# Patient Record
Sex: Male | Born: 1960 | Race: White | Hispanic: No | Marital: Married | Smoking: Never smoker
Health system: Southern US, Community
[De-identification: ages and names within clinical notes are randomized; demographics above are authoritative.]

## PROBLEM LIST (undated history)

## (undated) DIAGNOSIS — E785 Hyperlipidemia, unspecified: Secondary | ICD-10-CM

## (undated) DIAGNOSIS — I82409 Acute embolism and thrombosis of unspecified deep veins of unspecified lower extremity: Secondary | ICD-10-CM

## (undated) HISTORY — DX: Hyperlipidemia, unspecified: E78.5

## (undated) HISTORY — PX: TONSILLECTOMY: SUR1361

## (undated) HISTORY — PX: GALLBLADDER SURGERY: SHX652

## (undated) HISTORY — PX: COLONOSCOPY: SHX174

## (undated) HISTORY — PX: HEMORRHOID SURGERY: SHX153

## (undated) HISTORY — DX: Acute embolism and thrombosis of unspecified deep veins of unspecified lower extremity: I82.409

## (undated) HISTORY — PX: HERNIA REPAIR: SHX51

---
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
## 2003-07-28 ENCOUNTER — Encounter (INDEPENDENT_AMBULATORY_CARE_PROVIDER_SITE_OTHER): Payer: Self-pay | Admitting: Specialist

## 2003-07-28 ENCOUNTER — Ambulatory Visit (HOSPITAL_COMMUNITY): Admission: RE | Admit: 2003-07-28 | Discharge: 2003-07-28 | Payer: Self-pay | Admitting: *Deleted

## 2006-01-03 HISTORY — PX: HERNIA REPAIR: SHX51

## 2006-12-04 ENCOUNTER — Ambulatory Visit (HOSPITAL_BASED_OUTPATIENT_CLINIC_OR_DEPARTMENT_OTHER): Admission: RE | Admit: 2006-12-04 | Discharge: 2006-12-04 | Payer: Self-pay | Admitting: *Deleted

## 2006-12-04 ENCOUNTER — Encounter (INDEPENDENT_AMBULATORY_CARE_PROVIDER_SITE_OTHER): Payer: Self-pay | Admitting: *Deleted

## 2010-05-18 NOTE — Op Note (Signed)
NAME:  Adam [name], Adam [name] NO.:  [id number]   MEDICAL RECORD NO.:  [id number]          PATIENT TYPE:  AMB   LOCATION:  DSC                          FACILITY:  [facility]   PHYSICIAN:  Alfonse Ras, MD   DATE OF BIRTH:  June 14, 1960   DATE OF PROCEDURE:  12/04/2006  DATE OF DISCHARGE:                               OPERATIVE REPORT   PREOPERATIVE DIAGNOSIS:  Right inguinal hernia.   POSTOPERATIVE DIAGNOSIS:  Right inguinal hernia, indirect.   PROCEDURE:  Right inguinal hernia repair with mesh.   SURGEON:  Alfonse Ras, MD   ANESTHESIA:  General laryngeal mask.   DESCRIPTION:  The patient was taken to the operating room, placed in the  supine position.  After adequate general anesthesia was induced using  the laryngeal mask, the right groin was prepped and draped in the normal  sterile fashion.  Using an oblique incision over the inguinal canal, I  dissected down onto the external oblique fascia.  This was opened along  its fibers down to the external ring.  Flaps were created down to the  inguinal ligament, the Cooper's ligament, as well as along the  transversalis fascia and muscle.  The spermatic cord was surrounded with  a Penrose drain and the external ring.  It was mobilized.  Ilioinguinal  nerve was retracted superiorly.  Indirect hernia sac was dissected off  the spermatic cord and twisted and ligated at the internal ring.  The  preperitoneal fat was excised off the spermatic cord as well using Bovie  electrocautery.  A piece of 3 x 6 Atrium mesh was then placed over the  floor of Hesselbach's triangle and sutured to the pubic tubercle  medially, overlapping 2 cm along the transversalis fascia split and  brought out lateral to the internal ring.  It was tacked inferiorly to  the inguinal ligament.  All tissues were injected with 0.5 Marcaine.  Spermatic cord was placed in its natural position.  The external oblique  fascia was closed with a running 3-0  Vicryl suture.  Skin incision was  closed with staples.  A sterile dressing was applied.  The patient  tolerated the procedure well and went to PACU in a good condition.      Alfonse Ras, MD  Electronically Signed     KRE/MEDQ  D:  12/04/2006  T:  12/04/2006  Job:  [id number]

## 2010-05-21 NOTE — Op Note (Signed)
NAME:  Adam [name], Adam [name] NO.:  [id number]   MEDICAL RECORD NO.:  [id number]                   PATIENT TYPE:  AMB   LOCATION:  DAY                                  FACILITY:  [facility]   PHYSICIAN:  Vikki Ports, M.D.         DATE OF BIRTH:  [birth year]   DATE OF PROCEDURE:  07/28/2003  DATE OF DISCHARGE:                                 OPERATIVE REPORT   PREOPERATIVE DIAGNOSIS:  Grade III internal hemorrhoids with prolapse.   POSTOPERATIVE DIAGNOSIS:  Grade III internal hemorrhoids with prolapse.   PROCEDURE:  Procedure for prolapsing hemorrhoids and anopexy.   SURGEON:  Vikki Ports, M.D.   ANESTHESIA:  General.   DESCRIPTION OF PROCEDURE:  Patient was taken to the operating room and  placed in a supine position.  After general anesthesia was induced, the  patient was placed in the prone jack-knife position.  Perianal and rectal  prep undertaken.  Anal dilatation was accomplished to three fingers.  Hemorrhoidal bundles were injected with Marcaine and Wydase, and then the  internal and external sphincter muscles were injected with an additional 30  cc of 0.25% Marcaine without epi.  A 2-0 purse-string Prolene suture was  then placed approximately 5 cm proximal to the dentate line.  A stapling  device was placed.  The purse-string suture was tied down around the anvil.  The stapler was closed, held in place for 45 seconds, fired and removed.  Inspection of the staple line showed no evidence of bleeding.  Gelfoam  packing was placed.  Patient tolerated the procedure well and went to the  PACU in good condition.                                               Vikki Ports, M.D.    KRH/MEDQ  D:  07/28/2003  T:  07/28/2003  Job:  [id number]

## 2013-09-05 ENCOUNTER — Other Ambulatory Visit: Payer: Self-pay | Admitting: Internal Medicine

## 2013-09-05 ENCOUNTER — Ambulatory Visit
Admission: RE | Admit: 2013-09-05 | Discharge: 2013-09-05 | Disposition: A | Discharge status: Home / Self Care | Payer: BC Managed Care – PPO | Source: Ambulatory Visit | Attending: Internal Medicine | Admitting: Internal Medicine

## 2013-09-05 DIAGNOSIS — R0989 Other specified symptoms and signs involving the circulatory and respiratory systems: Principal | ICD-10-CM

## 2013-09-05 DIAGNOSIS — R0609 Other forms of dyspnea: Secondary | ICD-10-CM

## 2013-10-30 ENCOUNTER — Other Ambulatory Visit: Payer: Self-pay | Admitting: Internal Medicine

## 2013-10-30 DIAGNOSIS — R0600 Dyspnea, unspecified: Secondary | ICD-10-CM

## 2013-10-31 ENCOUNTER — Ambulatory Visit (INDEPENDENT_AMBULATORY_CARE_PROVIDER_SITE_OTHER): Payer: BC Managed Care – PPO | Admitting: Internal Medicine

## 2013-10-31 DIAGNOSIS — R0600 Dyspnea, unspecified: Secondary | ICD-10-CM

## 2013-10-31 LAB — PULMONARY FUNCTION TEST
DL/VA % pred: 138 %
DL/VA: 6.49 ml/min/mmHg/L
DLCO unc % pred: 127 %
DLCO unc: 42.89 ml/min/mmHg
FEF 25-75 POST: 5.01 L/s
FEF 25-75 Pre: 4.87 L/sec
FEF2575-%CHANGE-POST: 2 %
FEF2575-%PRED-POST: 144 %
FEF2575-%Pred-Pre: 140 %
FEV1-%Change-Post: 0 %
FEV1-%PRED-POST: 103 %
FEV1-%PRED-PRE: 104 %
FEV1-POST: 4.15 L
FEV1-PRE: 4.17 L
FEV1FVC-%CHANGE-POST: -2 %
FEV1FVC-%PRED-PRE: 111 %
FEV6-%Change-Post: 1 %
FEV6-%Pred-Post: 99 %
FEV6-%Pred-Pre: 97 %
FEV6-Post: 4.95 L
FEV6-Pre: 4.87 L
FEV6FVC-%Pred-Post: 104 %
FEV6FVC-%Pred-Pre: 104 %
FVC-%CHANGE-POST: 2 %
FVC-%Pred-Post: 96 %
FVC-%Pred-Pre: 94 %
FVC-Post: 4.99 L
FVC-Pre: 4.87 L
PRE FEV1/FVC RATIO: 86 %
PRE FEV6/FVC RATIO: 100 %
Post FEV1/FVC ratio: 83 %
Post FEV6/FVC ratio: 100 %
RV % PRED: 88 %
RV: 1.9 L
TLC % pred: 94 %
TLC: 6.77 L

## 2013-10-31 NOTE — Progress Notes (Signed)
PFT done today. 

## 2013-11-01 ENCOUNTER — Ambulatory Visit (INDEPENDENT_AMBULATORY_CARE_PROVIDER_SITE_OTHER): Payer: BC Managed Care – PPO | Admitting: Internal Medicine

## 2013-11-01 ENCOUNTER — Encounter: Payer: Self-pay | Admitting: Internal Medicine

## 2013-11-01 VITALS — BP 108/72 | HR 70 | Ht 71.0 in | Wt 205.0 lb

## 2013-11-01 DIAGNOSIS — R0609 Other forms of dyspnea: Secondary | ICD-10-CM | POA: Insufficient documentation

## 2013-11-01 NOTE — Assessment & Plan Note (Signed)
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 He did not improve with bronchodilators, or recognize benefit from trial.  I do not suspect ischemia, cardiomyopathy, Pulmonary Hypertension or anemia, but it is appropriate that he will be seeing cardiology next. An echo or cardiopulmonary stress test MIGHT show something. He credits Dr Alessandra BevelsVaughn with improving what sounds like a persistent bronchitis.  I will be able to see him back if needed.

## 2013-11-01 NOTE — Patient Instructions (Signed)
I agree with plan for you to see cardiology/ Dr Katrinka BlazingSmith If any further testing were cone, it might include an echocardiogram and/or cardiopulmonary stress test.

## 2013-11-01 NOTE — Progress Notes (Signed)
11/01/13- 53 yoM never smoker referred courtesy of Dr Alessandra BevelsVaughn FOLLOWS FOR: patient here for sob. Patient denies cough, wheeziing and chest tightness.  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 Little cough or wheeze now. He describes gradual onset of dyspnea with heavy exertion, abnormal for him, x 2 months. No sudden event. Wife says she doesn't notice it. Previously treated by PCP with steroids, inhalers- little difference. Strong odors trigger cough. Some seasonal pollen rhinitis. Allergy skin testing + dust mites, years ago. Broadly positive when tested as child. No allergy shots. Denies hx heart disease, anemia. 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 No asbestos. No chest pain, palpitation, leg pain/ swelling.  Father and PGF heavy smokers with COPD CXR 09/05/13-  IMPRESSION:  No active cardiopulmonary disease.  Electronically Signed  By: Dwyane DeePaul Barry M.D.  On: 09/05/2013 10:54 PFT 11/01/13- Within normal. FVC 4.99/ 96%, FEV1 4.15/ 103%, FEV1/FVC 0.83, FEF25-75% 5.01/144%. No response to BD, TLC 94%, DLCO 17%  Prior to Admission medications   Medication Sig Start Date End Date Taking? Authorizing Provider  Acetylcysteine (NAC 600) 600 MG CAPS Take 1 capsule by mouth 2 (two) times daily.   Yes Historical Provider, MD  Cholecalciferol (VITAMIN D-3) 5000 UNITS TABS Take 1 tablet by mouth daily.   Yes Historical Provider, MD  clomiPHENE (CLOMID) 50 MG tablet Take 50 mg by mouth daily. 10/14/13  Yes Historical Provider, MD  DHEA 25 MG CAPS Take 1 capsule by mouth daily.   Yes Historical Provider, MD  fenofibrate 160 MG tablet Take 160 mg by mouth daily. 09/21/13  Yes Historical Provider, MD  Potassium Gluconate 595 MG CAPS Take 595 mg by mouth daily.   Yes Historical Provider, MD  vitamin A 4332925000 UNIT capsule Take 25,000 Units by mouth daily.   Yes Historical Provider, MD   Past Medical History   Diagnosis Date  . Hyperlipidemia    Past Surgical History  Procedure Laterality Date  . Tonsillectomy    . Hemorrhoid surgery    . Gallbladder surgery    . Hernia repair     History reviewed. No pertinent family history. History   Social History  . Marital Status: Married    Spouse Name: N/A    Number of Children: 3  . Years of Education: 12   Occupational History  . Not on file.   Social History Main Topics  . Smoking status: Never Smoker   . Smokeless tobacco: Never Used  . Alcohol Use: No  . Drug Use: No  . Sexual Activity: Not on file   Other Topics Concern  . Not on file   Social History Narrative  . No narrative on file   ROS-see HPI Constitutional:   No-   weight loss, night sweats, fevers, chills, fatigue, lassitude. HEENT:   No-  headaches, +difficulty swallowing, tooth/dental problems, sore throat,       +sneezing, itching, +ear ache, +nasal congestion, post nasal drip,  CV:  No-   chest pain, orthopnea, PND, swelling in lower extremities, anasarca,                                  dizziness, palpitations Resp: +shortness of breath with exertion or at rest.              No-   productive cough,  No  non-productive cough,  No- coughing up of blood.              No-   change in color of mucus.  No- wheezing.   Skin: No-   rash or lesions. GI:  No-   heartburn, indigestion, abdominal pain, nausea, vomiting, diarrhea,                 change in bowel habits, loss of appetite GU: No-   dysuria, change in color of urine, no urgency or frequency.  No- flank pain. MS:  No-   joint pain or swelling.  No- decreased range of motion.  No- back pain. Neuro-     nothing unusual Psych:  No- change in mood or affect. No depression or anxiety.  No memory loss.  OBJ- Physical Exam General- Alert, Oriented, Affect-appropriate, Distress- none acute, +appears fit and well Skin- rash-none, lesions- none, excoriation- none Lymphadenopathy- none Head- atraumatic             Eyes- Gross vision intact, PERRLA, conjunctivae and secretions clear            Ears- Hearing, canals-normal            Nose- Clear, no-Septal dev, mucus, polyps, erosion, perforation             Throat- Mallampati II , mucosa clear , drainage- none, tonsils- atrophic Neck- flexible , trachea midline, no stridor , thyroid nl, carotid no bruit Chest - symmetrical excursion , unlabored           Heart/CV- RRR , no murmur , no gallop  , no rub, nl s1 s2                           - JVD- none , edema- none, stasis changes- none, varices- none           Lung- clear to P&A, wheeze- none, cough- none , dullness-none, rub- none           Chest wall-  Abd- tender-no, distended-no, bowel sounds-present, HSM- no Br/ Gen/ Rectal- Not done, not indicated Extrem- cyanosis- none, clubbing, none, atrophy- none, strength- nl Neuro- grossly intact to observation

## 2013-11-11 ENCOUNTER — Ambulatory Visit (INDEPENDENT_AMBULATORY_CARE_PROVIDER_SITE_OTHER): Payer: BC Managed Care – PPO | Admitting: Interventional Cardiology

## 2013-11-11 ENCOUNTER — Encounter: Payer: Self-pay | Admitting: Interventional Cardiology

## 2013-11-11 VITALS — BP 122/80 | HR 67 | Ht 70.5 in | Wt 203.2 lb

## 2013-11-11 DIAGNOSIS — R0609 Other forms of dyspnea: Secondary | ICD-10-CM

## 2013-11-11 DIAGNOSIS — R5383 Other fatigue: Secondary | ICD-10-CM | POA: Insufficient documentation

## 2013-11-11 NOTE — Patient Instructions (Addendum)
Your physician recommends that you continue on your current medications as directed. Please refer to the Current Medication list given to you today.  Your physician has requested that you have an exercise tolerance test. For further information please visit [email or web address]. Please also follow instruction sheet, as given.   Your physician recommends that you schedule a follow-up appointment as needed  

## 2013-11-11 NOTE — Progress Notes (Signed)
Patient ID: Adam [name], male   DOB: 06/25/1960, 53 y.o.   MRN: [id number]   Date: 11/11/2013 ID: Adam [name], DOB 05/29/1960, MRN [id number] PCP: Nonnie DoneSLATOSKY,JOHN J., MD  Reason: dyspnea  ASSESSMENT;  1. Dyspnea, poorly characterized, but exertional. No associated chest discomfort 2. Hypo-testosteronism 3. Hyperlipidemia  PLAN:  1. Exercise treadmill test   SUBJECTIVE: Adam [name] is a 53 y.o. male who is referred for cardiac evaluation. He gives a vague history. There is no chest discomfort. He talks of having dyspnea when he has intercourse. He has a problem with hypogonadism. He is active. Walking up an incline or rapidly also causes some dyspnea. This is been present for some time. He saw Dr. Jetty Duhamellinton Young for pulmonary assessment due to the dyspnea and apparently no significant abnormalities were found. A pulmonary function testan chest x-ray was unremarkable. He denies exertional chest pain. Is no history of heart disease. He denies hypertension.   Allergies  Allergen Reactions  . Ceftin [Cefuroxime Axetil] Rash  . Tetanus Toxoids Swelling    Swelling     Current Outpatient Prescriptions on File Prior to Visit  Medication Sig Dispense Refill  . Cholecalciferol (VITAMIN D-3) 5000 UNITS TABS Take 1 tablet by mouth daily.    . clomiPHENE (CLOMID) 50 MG tablet Take 50 mg by mouth daily.    Marland Kitchen. DHEA 25 MG CAPS Take 1 capsule by mouth daily.    . fenofibrate 160 MG tablet Take 160 mg by mouth every other day.     . Potassium Gluconate 595 MG CAPS Take 595 mg by mouth daily.    . vitamin A [id number] UNIT capsule Take 25,000 Units by mouth daily.    . Acetylcysteine (NAC 600) 600 MG CAPS Take 1 capsule by mouth 2 (two) times daily.     No current facility-administered medications on file prior to visit.    Past Medical History  Diagnosis Date  . Hyperlipidemia     Past Surgical History  Procedure Laterality Date  . Tonsillectomy    . Hemorrhoid surgery    .  Gallbladder surgery    . Hernia repair    . Hernia repair  2008  . Tonsillectomy  1968  . Hemorrhoid surgery      History   Social History  . Marital Status: Married    Spouse Name: N/A    Number of Children: 3  . Years of Education: 12   Occupational History  . Not on file.   Social History Main Topics  . Smoking status: Never Smoker   . Smokeless tobacco: Never Used  . Alcohol Use: No  . Drug Use: No  . Sexual Activity: Yes   Other Topics Concern  . Not on file   Social History Narrative    Family History  Problem Relation Age of Onset  . Hyperlipidemia Father   . Parkinson's disease Maternal Grandmother   . Cancer Paternal Grandfather     ROS: appetite is stable. No claudication, edema, orthopnea, palpitation, syncope, transient neurological symptoms, abdominal pain, nausea, vomiting.. Other systems negative for complaints.  OBJECTIVE: BP 122/80 mmHg  Pulse 67  Ht 5' 10.5" (1.791 m)  Wt 203 lb 3.2 oz (92.171 kg)  BMI 28.73 kg/m2,  General: No acute distress, slender, bearded, in no distress HEENT: normal without jaundice or pallor Neck: JVD flat. Carotids absent Chest: clear Cardiac: Murmur: normal. Gallop: none. Rhythm: normal. Other: normal Abdomen: Bruit: absent. Pulsation: absent Extremities: Edema: normal. Pulses: 2+ bilateral in  all stations Neuro: normal Psych: anxious  ECG: normal

## 2013-12-05 ENCOUNTER — Ambulatory Visit (HOSPITAL_COMMUNITY)
Admission: RE | Admit: 2013-12-05 | Discharge: 2013-12-05 | Disposition: A | Discharge status: Home / Self Care | Payer: BC Managed Care – PPO | Source: Ambulatory Visit | Attending: Cardiovascular Disease | Admitting: Cardiovascular Disease

## 2013-12-05 DIAGNOSIS — R0609 Other forms of dyspnea: Secondary | ICD-10-CM | POA: Insufficient documentation

## 2013-12-05 NOTE — Procedures (Signed)
Exercise Treadmill Test  Pre-Exercise Testing Evaluation  NSR, normal  Test  Exercise Tolerance Test Ordering MD: Lyn RecordsHenry W. Smith [name]    Unique Test No: 1  Treadmill:  1  Indication for ETT: exertional dyspnea  Contraindication to ETT: No   Stress Modality: exercise - treadmill  Cardiac Imaging Performed: non   Protocol: standard Bruce - maximal  Max BP:  199/103  Max MPHR (bpm):  168 85% MPR (bpm):  143  MPHR obtained (bpm):  171 % MPHR obtained:  101  Reached 85% MPHR (min:sec):  10:40  Total Exercise Time (min-sec):  13  Workload in METS:  15.3 Borg Scale: 15  Reason ETT Terminated:  fatigue    ST Segment Analysis At Rest: normal ST segments - no evidence of significant ST depression With Exercise: no evidence of significant ST depression  Other Information Arrhythmia:  No Angina during ETT:  absent (0) Quality of ETT:  diagnostic  ETT Interpretation:  normal - no evidence of ischemia by ST analysis  Comments: Excellent exercise tolerance. Hypertension at baseline and hypertensive response to exercise. Duke score 7.  Adam FairMihai Tishawn Friedhoff, MD, [name] [phone] office [phone] pager

## 2013-12-06 ENCOUNTER — Telehealth: Payer: Self-pay

## 2013-12-06 NOTE — Telephone Encounter (Signed)
Called to give pt gxt results.lmtcb 

## 2013-12-06 NOTE — Telephone Encounter (Signed)
-----   Message from Lesleigh NoeHenry W Smith III, MD sent at 12/05/2013  8:14 PM EST ----- Did well on the treadmill. There is some concern about blood pressure. He needs to have his blood pressure monitored 2-3 times per year for target being less than 140/90 mmHg. If he is above these levels most of the time, he will need to start therapy.

## 2013-12-06 NOTE — Telephone Encounter (Signed)
-----   Message from Adam W Smith III, MD sent at 12/05/2013  8:14 PM EST ----- Did well on the treadmill. There is some concern about blood pressure. He needs to have his blood pressure monitored 2-3 times per year for target being less than 140/90 mmHg. If he is above these levels most of the time, he will need to start therapy. 

## 2013-12-06 NOTE — Telephone Encounter (Signed)
Pt aware of gxt results.  Did well on the treadmill. There is some concern about blood pressure. He needs to have his blood pressure monitored 2-3 times per year for target being less than 140/90 mmHg. If he is above these levels most of the time, he will need to start therapy.  pt verbalized understanding.

## 2014-09-06 DIAGNOSIS — K219 Gastro-esophageal reflux disease without esophagitis: Secondary | ICD-10-CM | POA: Insufficient documentation

## 2014-09-06 DIAGNOSIS — J45909 Unspecified asthma, uncomplicated: Secondary | ICD-10-CM | POA: Insufficient documentation

## 2014-12-01 IMAGING — CR DG CHEST 2V
2 series · 2 of 2 positions shown · non-contrast
Comparison: None.

CLINICAL DATA: Shortness of breath for several months

EXAM:
CHEST  2 VIEW

[view not recorded (1 of 2)]
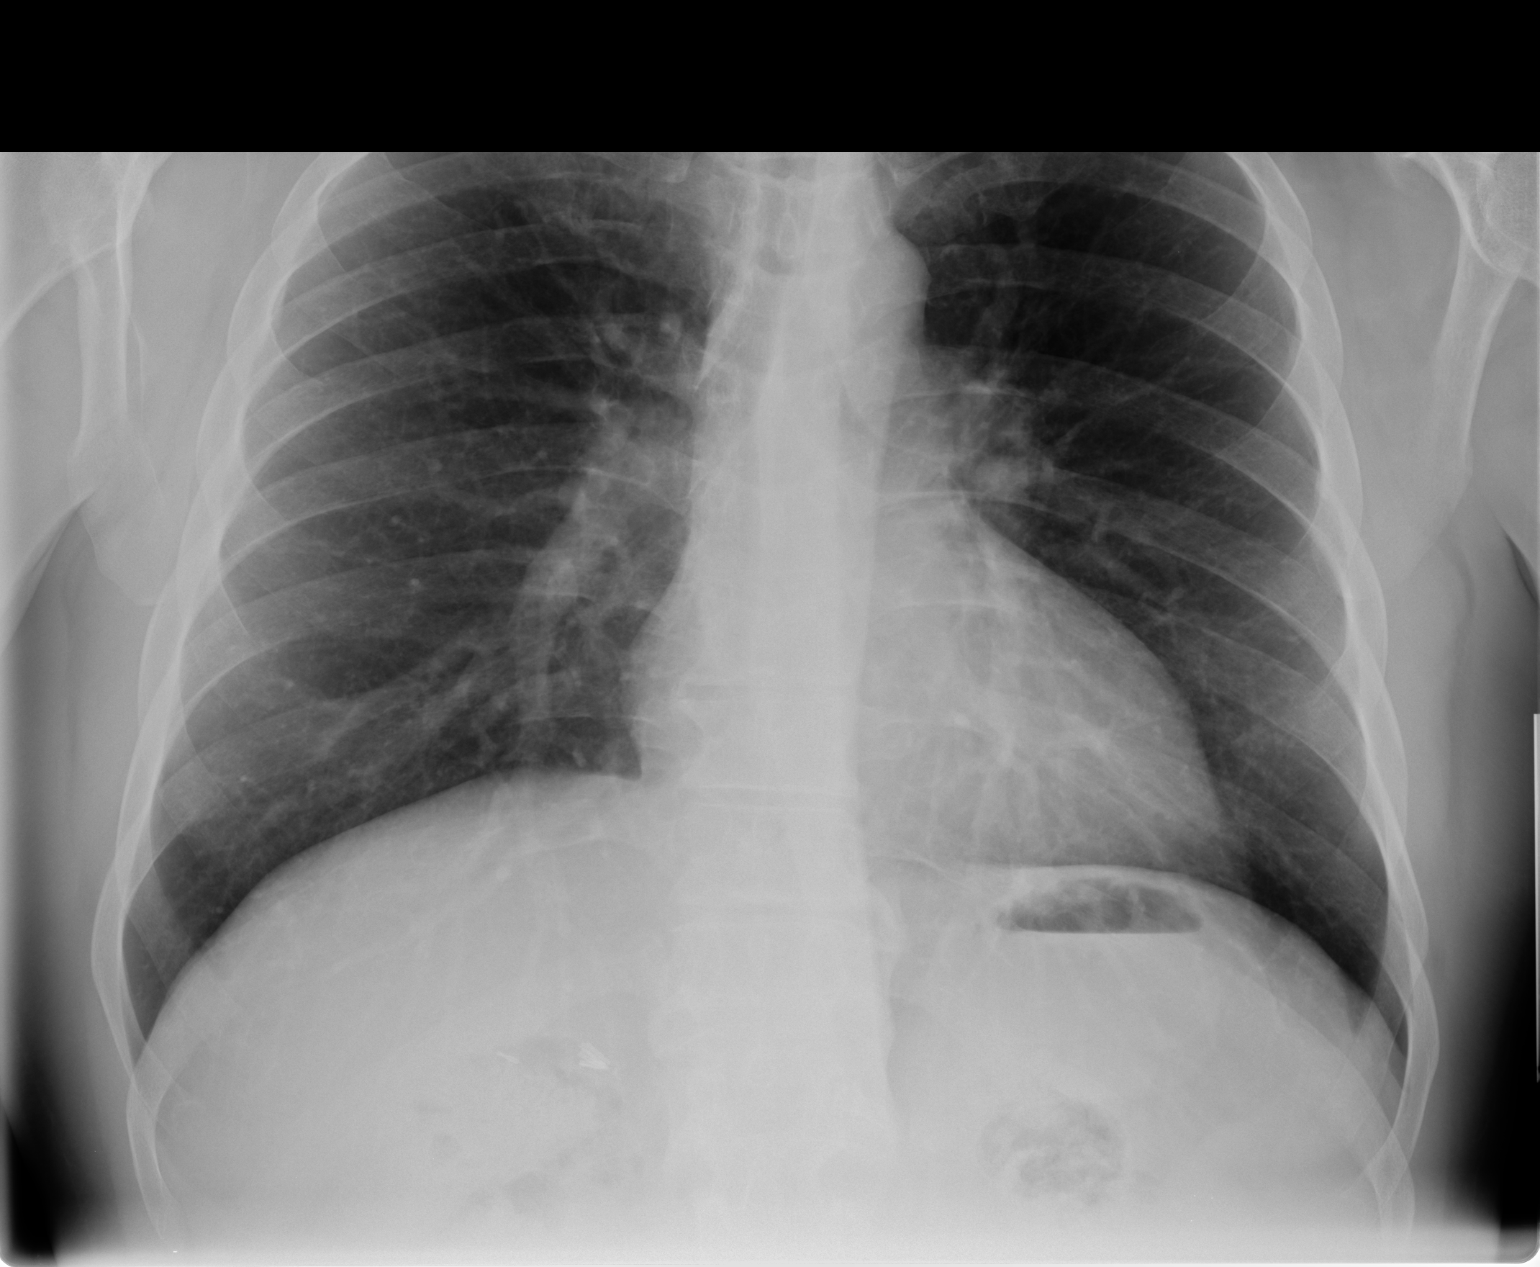

[view not recorded (2 of 2)]
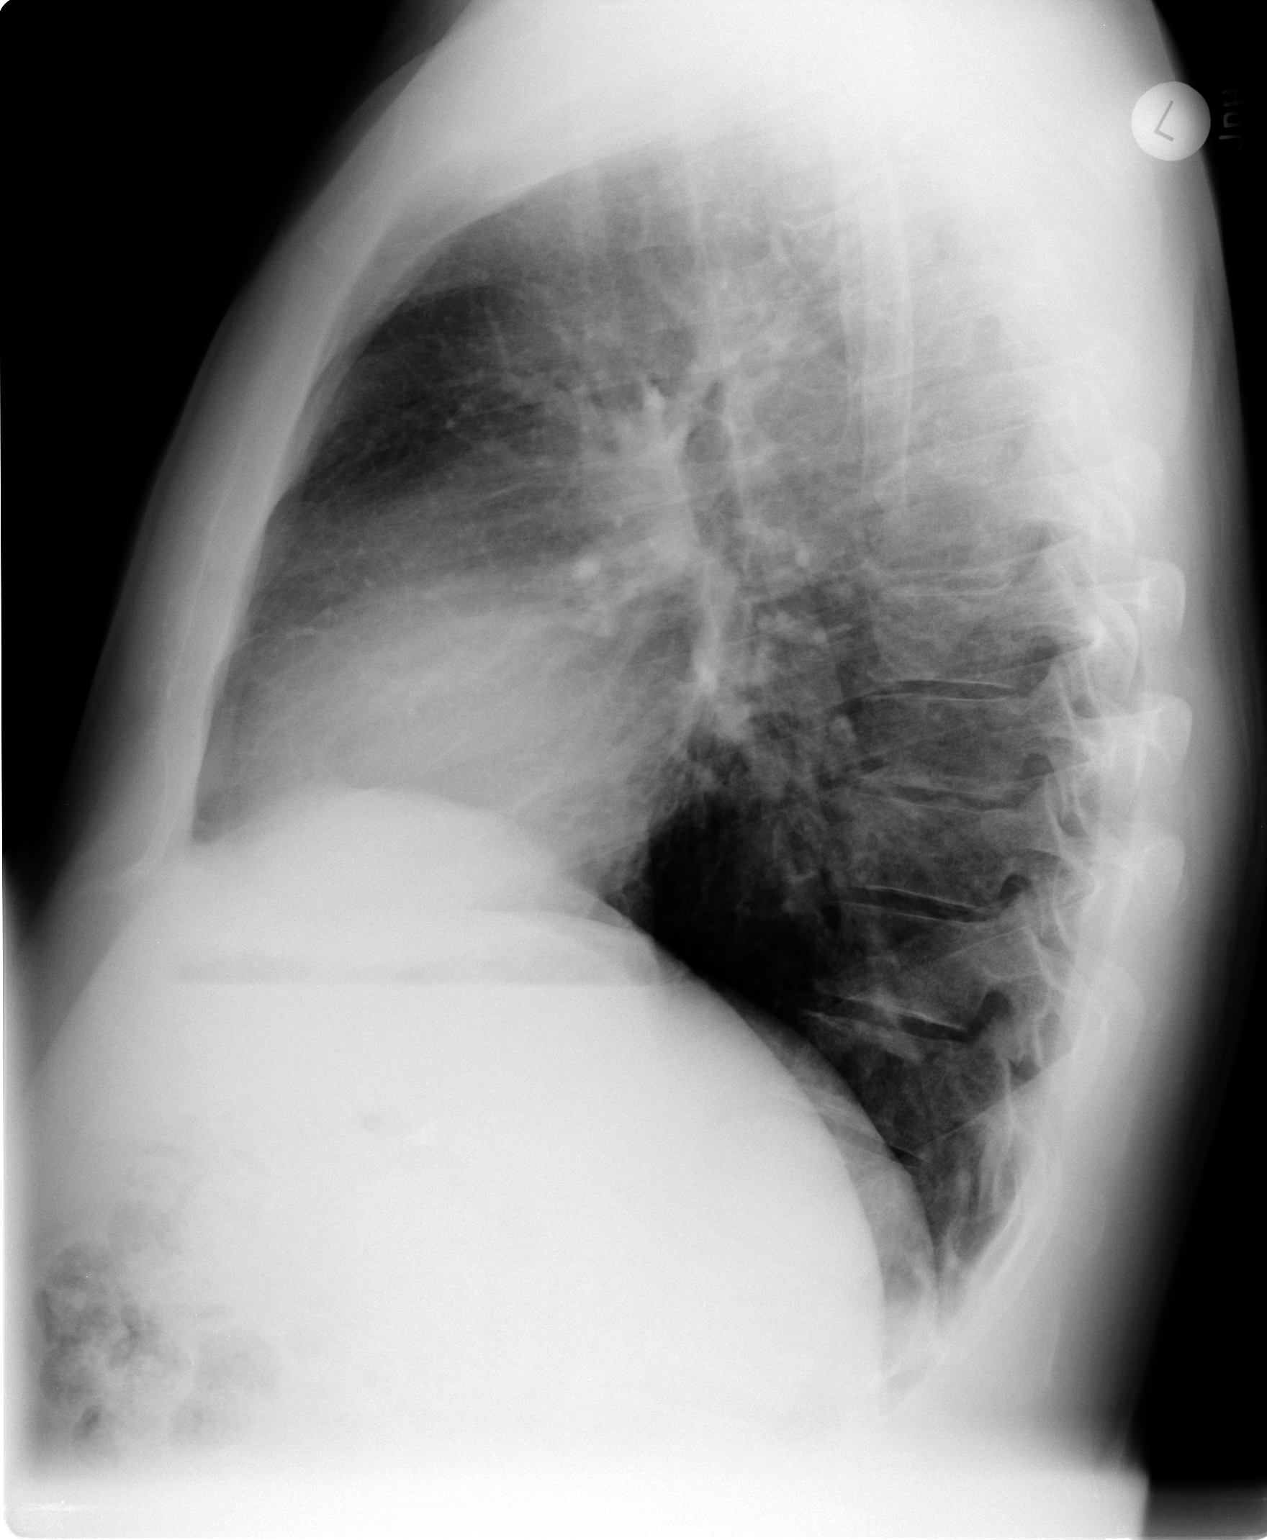

[2 of 2 positions shown; findings below may reference images not displayed]

FINDINGS: No active infiltrate or effusion is seen. Mediastinal and hilar
contours are unremarkable. The heart is within normal limits in
size. No bony abnormality is seen. Surgical clips are present in the
right upper quadrant from prior cholecystectomy.
IMPRESSION: No active cardiopulmonary disease.

## 2019-05-30 ENCOUNTER — Other Ambulatory Visit: Payer: Self-pay

## 2019-05-30 ENCOUNTER — Ambulatory Visit: Payer: Self-pay | Admitting: Family Medicine

## 2019-05-30 ENCOUNTER — Ambulatory Visit: Payer: Self-pay

## 2019-05-30 ENCOUNTER — Encounter: Payer: Self-pay | Admitting: Family Medicine

## 2019-05-30 VITALS — BP 120/90 | HR 81 | Ht 70.0 in | Wt 219.0 lb

## 2019-05-30 DIAGNOSIS — S56519A Strain of other extensor muscle, fascia and tendon at forearm level, unspecified arm, initial encounter: Secondary | ICD-10-CM

## 2019-05-30 DIAGNOSIS — M25521 Pain in right elbow: Secondary | ICD-10-CM

## 2019-05-30 MED ORDER — GABAPENTIN 100 MG PO CAPS: 200.0000 mg | ORAL_CAPSULE | Freq: Every day | ORAL | 3 refills | Status: DC

## 2019-05-30 MED ORDER — NITROGLYCERIN 0.2 MG/HR TD PT24: MEDICATED_PATCH | TRANSDERMAL | 1 refills | Status: DC

## 2019-05-30 NOTE — Progress Notes (Signed)
[name] [address] [place] [address] Phone: [phone] Subjective:   I Adam [name] am serving as a Neurosurgeon for Dr. Antoine Primas.  This visit occurred during the SARS-CoV-2 public health emergency.  Safety protocols were in place, including screening questions prior to the visit, additional usage of staff PPE, and extensive cleaning of exam room while observing appropriate contact time as indicated for disinfecting solutions.   I'm seeing this patient by the request  of:  Adam [name]., MD  CC: Right elbow pain  SWN:[id number]  Adam [name] is a 59 y.o. male coming in with complaint of right elbow pain. Pain radiates from the shoulder and radiates down the the arm. History of tennis elbow. Pain is intermitted.   Onset- chronic  Location - lateral Duration- painful throughout the day Character- achy  Aggravating factors- extension, flexion  Reliving factors-  Therapies tried- wears strap over forearm Severity- 0/10 at its worse      Past Medical History:  Diagnosis Date  . Hyperlipidemia    Past Surgical History:  Procedure Laterality Date  . GALLBLADDER SURGERY    . HEMORRHOID SURGERY    . HEMORRHOID SURGERY    . HERNIA REPAIR    . HERNIA REPAIR  2008  . TONSILLECTOMY    . TONSILLECTOMY  1968   Social History   Socioeconomic History  . Marital status: Married    Spouse name: Not on file  . Number of children: 3  . Years of education: 69  . Highest education level: Not on file  Occupational History  . Not on file  Tobacco Use  . Smoking status: Never Smoker  . Smokeless tobacco: Never Used  Substance and Sexual Activity  . Alcohol use: No  . Drug use: No  . Sexual activity: Yes  Other Topics Concern  . Not on file  Social History Narrative  . Not on file   Social Determinants of Health   Financial Resource Strain:   . Difficulty of Paying Living Expenses:   Food Insecurity:   . Worried About  Programme researcher, broadcasting/film/video in the Last Year:   . Barista in the Last Year:   Transportation Needs:   . Freight forwarder (Medical):   Marland Kitchen Lack of Transportation (Non-Medical):   Physical Activity:   . Days of Exercise per Week:   . Minutes of Exercise per Session:   Stress:   . Feeling of Stress :   Social Connections:   . Frequency of Communication with Friends and Family:   . Frequency of Social Gatherings with Friends and Family:   . Attends Religious Services:   . Active Member of Clubs or Organizations:   . Attends Banker Meetings:   Marland Kitchen Marital Status:    Allergies  Allergen Reactions  . Ceftin [Cefuroxime Axetil] Rash  . Tetanus Toxoids Swelling    Swelling    Family History  Problem Relation Age of Onset  . Hyperlipidemia Father   . Parkinson's disease Maternal Grandmother   . Cancer Paternal Grandfather     Current Outpatient Medications (Endocrine & Metabolic):  .  clomiPHENE (CLOMID) 50 MG tablet, Take 50 mg by mouth daily.  Current Outpatient Medications (Cardiovascular):  .  fenofibrate 160 MG tablet, Take 160 mg by mouth every other day.  .  nitroGLYCERIN (NITRODUR - DOSED IN MG/24 HR) 0.2 mg/hr patch, 1/4 patch daily  Current Outpatient Medications (Respiratory):  .  Albuterol Sulfate (PROAIR HFA IN), Inhale into the lungs as needed. .  mometasone (ASMANEX 14 METERED DOSES) 220 MCG/INH inhaler, Inhale 1 puff into the lungs daily. .  mometasone (NASONEX) 50 MCG/ACT nasal spray, Place 1 spray into the nose daily.    Current Outpatient Medications (Other):  Marland Kitchen  Acetylcysteine (NAC 600) 600 MG CAPS, Take 1 capsule by mouth 2 (two) times daily. .  Cholecalciferol (VITAMIN D-3) 5000 UNITS TABS, Take 1 tablet by mouth daily. Marland Kitchen  DHEA 25 MG CAPS, Take 1 capsule by mouth daily. Javier Docker Oil 300 MG CAPS, Take 1 capsule by mouth 2 (two) times daily. .  Potassium Gluconate 595 MG CAPS, Take 595 mg by mouth daily. .  ranitidine (ZANTAC) 300 MG  tablet, Take 300 mg by mouth at bedtime. .  vitamin A 25000 UNIT capsule, Take 25,000 Units by mouth daily. Marland Kitchen  gabapentin (NEURONTIN) 100 MG capsule, Take 2 capsules (200 mg total) by mouth at bedtime.   Reviewed prior external information including notes and imaging from  primary care provider As well as notes that were available from care everywhere and other healthcare systems.  Past medical history, social, surgical and family history all reviewed in electronic medical record.  No pertanent information unless stated regarding to the chief complaint.   Review of Systems:  No headache, visual changes, nausea, vomiting, diarrhea, constipation, dizziness, abdominal pain, skin rash, fevers, chills, night sweats, weight loss, swollen lymph nodes, body aches, joint swelling, chest pain, shortness of breath, mood changes. POSITIVE muscle aches  Objective  Blood pressure 120/90, pulse 81, height 5\' 10"  (1.778 m), weight 219 lb (99.3 kg), SpO2 96 %.   General: No apparent distress alert and oriented x3 mood and affect normal, dressed appropriately.  HEENT: Pupils equal, extraocular movements intact  Respiratory: Patient's speak in full sentences and does not appear short of breath  Cardiovascular: No lower extremity edema, non tender, no erythema  Neuro: Cranial nerves II through XII are intact, neurovascularly intact in all extremities with 2+ DTRs and 2+ pulses.  Gait normal with good balance and coordination.  MSK:   Right elbow shows the patient does have tenderness to palpation of the lateral epicondylar region.  4-5 strength with wrist extension against resistance.  This is significantly worse than the contralateral side.  Mild decrease in grip strength noted.  Limited musculoskeletal ultrasound was performed and interpreted by Lyndal Pulley  Limited ultrasound of patient's elbow shows the patient does have a large tear of the common extensor tendon noted.  Mild retraction noted.   Increasing Doppler flow in hypoechoic changes surrounding the area.  No cortical irregularity or avulsion noted.    Impression and Recommendations:     This case required medical decision making of moderate complexity. The above documentation has been reviewed and is accurate and complete Lyndal Pulley, DO       Note: This dictation was prepared with Dragon dictation along with smaller phrase technology. Any transcriptional errors that result from this process are unintentional.

## 2019-05-30 NOTE — Assessment & Plan Note (Signed)
Noted on ultrasound.  Given stability of the elbow noted though.  Patient should do relatively well with conservative therapy.  Discussed icing regimen and home exercises, discussed avoiding certain activities, patient is to try a wrist brace day and night for 2 weeks increase activity slowly.  Follow-up with me again in 4 to 8 weeks for further evaluation and treatment.

## 2019-05-30 NOTE — Patient Instructions (Signed)
Good to see you  Keep hands with thumbs up or underhand Wear wrist brace day and night for 2 weeks then nightly for 2 weeks pennsaid pinkie amount topically 2 times daily as needed.  Nitroglycerin Protocol   Apply 1/4 nitroglycerin patch to affected area daily.  Change position of patch within the affected area every 24 hours.  You may experience a headache during the first 1-2 weeks of using the patch, these should subside.  If you experience headaches after beginning nitroglycerin patch treatment, you may take your preferred over the counter pain reliever.  Another side effect of the nitroglycerin patch is skin irritation or rash related to patch adhesive.  Please notify our office if you develop more severe headaches or rash, and stop the patch.  Tendon healing with nitroglycerin patch may require 12 to 24 weeks depending on the extent of injury.  Men should not use if taking Viagra, Cialis, or Levitra.   Do not use if you have migraines or rosacea.  Gabapentin 200mg  at night  Exercises 3 times a week.  See me again in 5-6 weeks

## 2019-06-04 ENCOUNTER — Ambulatory Visit: Payer: Self-pay | Admitting: Family Medicine

## 2019-06-18 ENCOUNTER — Ambulatory Visit: Payer: Self-pay | Admitting: Family Medicine

## 2019-07-04 ENCOUNTER — Encounter: Payer: Self-pay | Admitting: Family Medicine

## 2019-07-04 ENCOUNTER — Other Ambulatory Visit: Payer: Self-pay

## 2019-07-04 ENCOUNTER — Ambulatory Visit: Payer: Self-pay

## 2019-07-04 ENCOUNTER — Ambulatory Visit: Payer: Self-pay | Admitting: Family Medicine

## 2019-07-04 VITALS — BP 138/84 | HR 79 | Ht 70.0 in | Wt 220.0 lb

## 2019-07-04 DIAGNOSIS — S56519A Strain of other extensor muscle, fascia and tendon at forearm level, unspecified arm, initial encounter: Secondary | ICD-10-CM

## 2019-07-04 DIAGNOSIS — M25521 Pain in right elbow: Secondary | ICD-10-CM

## 2019-07-04 NOTE — Assessment & Plan Note (Signed)
Interval improvement noted on ultrasound. Discussed which activities to doing which wants to avoid. Patient encouraged to wear the brace at night still. Continue the nitroglycerin. Follow-up again in 2 months

## 2019-07-04 NOTE — Patient Instructions (Addendum)
Good to see you Pennsaid We are doing well overall Add ice after activity 20 mins before bed Overhand lifting See me again in 2 months

## 2019-07-04 NOTE — Progress Notes (Signed)
[name] [address] [place] [address] Phone: [phone] Subjective:    I'm seeing this patient by the request  of:  Adam Done., MD  CC: Right elbow pain follow-up  [id number]   05/30/2019 Noted on ultrasound.  Given stability of the elbow noted though.  Patient should do relatively well with conservative therapy.  Discussed icing regimen and home exercises, discussed avoiding certain activities, patient is to try a wrist brace day and night for 2 weeks increase activity slowly.  Follow-up with me again in 4 to 8 weeks for further evaluation and treatment.  Update 07/04/2019 Adam [name] is a 59 y.o. male coming in with complaint of right elbow pain. Patient states he believes he is getting better. Not throbbing constant pain. Still pain with certain movements. States approximately 30 to 40% better.       Past Medical History:  Diagnosis Date  . Hyperlipidemia    Past Surgical History:  Procedure Laterality Date  . GALLBLADDER SURGERY    . HEMORRHOID SURGERY    . HEMORRHOID SURGERY    . HERNIA REPAIR    . HERNIA REPAIR  2008  . TONSILLECTOMY    . TONSILLECTOMY  1968   Social History   Socioeconomic History  . Marital status: Married    Spouse name: Not on file  . Number of children: 3  . Years of education: 22  . Highest education level: Not on file  Occupational History  . Not on file  Tobacco Use  . Smoking status: Never Smoker  . Smokeless tobacco: Never Used  Substance and Sexual Activity  . Alcohol use: No  . Drug use: No  . Sexual activity: Yes  Other Topics Concern  . Not on file  Social History Narrative  . Not on file   Social Determinants of Health   Financial Resource Strain:   . Difficulty of Paying Living Expenses:   Food Insecurity:   . Worried About Programme researcher, broadcasting/film/video in the Last Year:   . Barista in the Last Year:   Transportation Needs:   . Freight forwarder  (Medical):   Marland Kitchen Lack of Transportation (Non-Medical):   Physical Activity:   . Days of Exercise per Week:   . Minutes of Exercise per Session:   Stress:   . Feeling of Stress :   Social Connections:   . Frequency of Communication with Friends and Family:   . Frequency of Social Gatherings with Friends and Family:   . Attends Religious Services:   . Active Member of Clubs or Organizations:   . Attends Banker Meetings:   Marland Kitchen Marital Status:    Allergies  Allergen Reactions  . Ceftin [Cefuroxime Axetil] Rash  . Tetanus Toxoids Swelling    Swelling    Family History  Problem Relation Age of Onset  . Hyperlipidemia Father   . Parkinson's disease Maternal Grandmother   . Cancer Paternal Grandfather     Current Outpatient Medications (Endocrine & Metabolic):  .  clomiPHENE (CLOMID) 50 MG tablet, Take 50 mg by mouth daily.  Current Outpatient Medications (Cardiovascular):  .  fenofibrate 160 MG tablet, Take 160 mg by mouth every other day.  .  nitroGLYCERIN (NITRODUR - DOSED IN MG/24 HR) 0.2 mg/hr patch, 1/4 patch daily  Current Outpatient Medications (Respiratory):  Marland Kitchen  Albuterol Sulfate (PROAIR HFA IN), Inhale into the lungs as needed. .  mometasone (ASMANEX 14 METERED DOSES)  220 MCG/INH inhaler, Inhale 1 puff into the lungs daily. .  mometasone (NASONEX) 50 MCG/ACT nasal spray, Place 1 spray into the nose daily.    Current Outpatient Medications (Other):  Marland Kitchen  Acetylcysteine (NAC 600) 600 MG CAPS, Take 1 capsule by mouth 2 (two) times daily. .  Cholecalciferol (VITAMIN D-3) 5000 UNITS TABS, Take 1 tablet by mouth daily. Marland Kitchen  DHEA 25 MG CAPS, Take 1 capsule by mouth daily. Marland Kitchen  gabapentin (NEURONTIN) 100 MG capsule, Take 2 capsules (200 mg total) by mouth at bedtime. [name] Oil 300 MG CAPS, Take 1 capsule by mouth 2 (two) times daily. .  Potassium Gluconate 595 MG CAPS, Take 595 mg by mouth daily. .  ranitidine (ZANTAC) 300 MG tablet, Take 300 mg by mouth at  bedtime. .  vitamin A 53614 UNIT capsule, Take 25,000 Units by mouth daily.   Reviewed prior external information including notes and imaging from  primary care provider As well as notes that were available from care everywhere and other healthcare systems.  Past medical history, social, surgical and family history all reviewed in electronic medical record.  No pertanent information unless stated regarding to the chief complaint.   Review of Systems:  No headache, visual changes, nausea, vomiting, diarrhea, constipation, dizziness, abdominal pain, skin rash, fevers, chills, night sweats, weight loss, swollen lymph nodes, body aches, joint swelling, chest pain, shortness of breath, mood changes. POSITIVE muscle aches  Objective  There were no vitals taken for this visit.   General: No apparent distress alert and oriented x3 mood and affect normal, dressed appropriately.  HEENT: Pupils equal, extraocular movements intact  Respiratory: Patient's speak in full sentences and does not appear short of breath  Cardiovascular: No lower extremity edema, non tender, no erythema  Neuro: Cranial nerves II through XII are intact, neurovascularly intact in all extremities with 2+ DTRs and 2+ pulses.  Gait normal with good balance and coordination.  MSK:  Right elbow exam still tender to palpation over the lateral epicondylar region. Pain with resisted extension of the wrist still noted. No swelling in the area. Bruising over the medial epicondyle noted but nontender  Limited musculoskeletal ultrasound was performed and interpreted by Adam [name]  Limited ultrasound of patient's right elbow shows the patient does still have a partial tear noted of the common extensor tendon. Does show some interval healing noted with scar tissue formation and increased neovascularization.    Impression and Recommendations:     The above documentation has been reviewed and is accurate and complete Adam Saa,  DO       Note: This dictation was prepared with Dragon dictation along with smaller phrase technology. Any transcriptional errors that result from this process are unintentional.

## 2019-09-04 NOTE — Progress Notes (Deleted)
[name] [address] [place] [address] Phone: [phone] Subjective:    I'm seeing this patient by the request  of:  Nonnie Done., MD  CC:   [id number]   07/04/2019 Interval improvement noted on ultrasound. Discussed which activities to doing which wants to avoid. Patient encouraged to wear the brace at night still. Continue the nitroglycerin. Follow-up again in 2 months  Update 09/05/2019 Adam [name] is a 59 y.o. male coming in with complaint of right elbow pain.      Past Medical History:  Diagnosis Date  . Hyperlipidemia    Past Surgical History:  Procedure Laterality Date  . GALLBLADDER SURGERY    . HEMORRHOID SURGERY    . HEMORRHOID SURGERY    . HERNIA REPAIR    . HERNIA REPAIR  2008  . TONSILLECTOMY    . TONSILLECTOMY  1968   Social History   Socioeconomic History  . Marital status: Married    Spouse name: Not on file  . Number of children: 3  . Years of education: 77  . Highest education level: Not on file  Occupational History  . Not on file  Tobacco Use  . Smoking status: Never Smoker  . Smokeless tobacco: Never Used  Substance and Sexual Activity  . Alcohol use: No  . Drug use: No  . Sexual activity: Yes  Other Topics Concern  . Not on file  Social History Narrative  . Not on file   Social Determinants of Health   Financial Resource Strain:   . Difficulty of Paying Living Expenses: Not on file  Food Insecurity:   . Worried About Programme researcher, broadcasting/film/video in the Last Year: Not on file  . Ran Out of Food in the Last Year: Not on file  Transportation Needs:   . Lack of Transportation (Medical): Not on file  . Lack of Transportation (Non-Medical): Not on file  Physical Activity:   . Days of Exercise per Week: Not on file  . Minutes of Exercise per Session: Not on file  Stress:   . Feeling of Stress : Not on file  Social Connections:   . Frequency of Communication with Friends and Family: Not  on file  . Frequency of Social Gatherings with Friends and Family: Not on file  . Attends Religious Services: Not on file  . Active Member of Clubs or Organizations: Not on file  . Attends Banker Meetings: Not on file  . Marital Status: Not on file   Allergies  Allergen Reactions  . Ceftin [Cefuroxime Axetil] Rash  . Tetanus Toxoids Swelling    Swelling    Family History  Problem Relation Age of Onset  . Hyperlipidemia Father   . Parkinson's disease Maternal Grandmother   . Cancer Paternal Grandfather     Current Outpatient Medications (Endocrine & Metabolic):  .  clomiPHENE (CLOMID) 50 MG tablet, Take 50 mg by mouth daily.  Current Outpatient Medications (Cardiovascular):  .  fenofibrate 160 MG tablet, Take 160 mg by mouth every other day.  .  nitroGLYCERIN (NITRODUR - DOSED IN MG/24 HR) 0.2 mg/hr patch, 1/4 patch daily  Current Outpatient Medications (Respiratory):  Marland Kitchen  Albuterol Sulfate (PROAIR HFA IN), Inhale into the lungs as needed. .  mometasone (ASMANEX 14 METERED DOSES) 220 MCG/INH inhaler, Inhale 1 puff into the lungs daily. .  mometasone (NASONEX) 50 MCG/ACT nasal spray, Place 1 spray into the nose daily.    Current Outpatient  Medications (Other):  Marland Kitchen  Acetylcysteine (NAC 600) 600 MG CAPS, Take 1 capsule by mouth 2 (two) times daily. .  Cholecalciferol (VITAMIN D-3) 5000 UNITS TABS, Take 1 tablet by mouth daily. Marland Kitchen  DHEA 25 MG CAPS, Take 1 capsule by mouth daily. Marland Kitchen  gabapentin (NEURONTIN) 100 MG capsule, Take 2 capsules (200 mg total) by mouth at bedtime. Boris Lown Oil 300 MG CAPS, Take 1 capsule by mouth 2 (two) times daily. .  Potassium Gluconate 595 MG CAPS, Take 595 mg by mouth daily. .  ranitidine (ZANTAC) 300 MG tablet, Take 300 mg by mouth at bedtime. .  vitamin A 18563 UNIT capsule, Take 25,000 Units by mouth daily.   Reviewed prior external information including notes and imaging from  primary care provider As well as notes that were  available from care everywhere and other healthcare systems.  Past medical history, social, surgical and family history all reviewed in electronic medical record.  No pertanent information unless stated regarding to the chief complaint.   Review of Systems:  No headache, visual changes, nausea, vomiting, diarrhea, constipation, dizziness, abdominal pain, skin rash, fevers, chills, night sweats, weight loss, swollen lymph nodes, body aches, joint swelling, chest pain, shortness of breath, mood changes. POSITIVE muscle aches  Objective  There were no vitals taken for this visit.   General: No apparent distress alert and oriented x3 mood and affect normal, dressed appropriately.  HEENT: Pupils equal, extraocular movements intact  Respiratory: Patient's speak in full sentences and does not appear short of breath  Cardiovascular: No lower extremity edema, non tender, no erythema  Neuro: Cranial nerves II through XII are intact, neurovascularly intact in all extremities with 2+ DTRs and 2+ pulses.  Gait normal with good balance and coordination.  MSK:  Non tender with full range of motion and good stability and symmetric strength and tone of shoulders, elbows, wrist, hip, knee and ankles bilaterally.     Impression and Recommendations:     The above documentation has been reviewed and is accurate and complete Wilford Grist       Note: This dictation was prepared with Dragon dictation along with smaller phrase technology. Any transcriptional errors that result from this process are unintentional.

## 2019-09-05 ENCOUNTER — Ambulatory Visit: Payer: Self-pay | Admitting: Family Medicine

## 2019-10-09 ENCOUNTER — Ambulatory Visit: Payer: Self-pay | Admitting: Family Medicine

## 2019-10-09 ENCOUNTER — Ambulatory Visit: Payer: Self-pay

## 2019-10-09 ENCOUNTER — Other Ambulatory Visit: Payer: Self-pay

## 2019-10-09 ENCOUNTER — Encounter: Payer: Self-pay | Admitting: Family Medicine

## 2019-10-09 VITALS — BP 120/88 | HR 86 | Ht 70.0 in | Wt 217.0 lb

## 2019-10-09 DIAGNOSIS — M25521 Pain in right elbow: Secondary | ICD-10-CM

## 2019-10-09 DIAGNOSIS — S56519A Strain of other extensor muscle, fascia and tendon at forearm level, unspecified arm, initial encounter: Secondary | ICD-10-CM

## 2019-10-09 NOTE — Patient Instructions (Signed)
Exercises Elbow looks good-2 months until it's healed Be aware of positioning of hands with lifting Keep shoulder blades back Have appt in 2 months in case

## 2019-10-09 NOTE — Assessment & Plan Note (Addendum)
Patient on ultrasound shows the patient still has some mild hypoechoic changes as well as some increasing Doppler flow.  I would state that patient is approximately 80 to 85%..  Still not complete.  We discussed the different medications including the gabapentin that can help for some of the neck pain as well.  Given exercises to work on the upper back.  Follow-up again in 2 to 3 months.  This is difficult to heal secondary to patient being very very active.

## 2019-10-09 NOTE — Progress Notes (Signed)
[name] [address] [place] [address] Phone: [phone] Subjective:   [name], am serving as a scribe for Dr. Antoine Primas. This visit occurred during the SARS-CoV-2 public health emergency.  Safety protocols were in place, including screening questions prior to the visit, additional usage of staff PPE, and extensive cleaning of exam room while observing appropriate contact time as indicated for disinfecting solutions.  I'm seeing this patient by the request  of:  Nonnie Done., MD  CC: Right elbow pain follow-up  [id number]   07/04/2019 Interval improvement noted on ultrasound. Discussed which activities to doing which wants to avoid. Patient encouraged to wear the brace at night still. Continue the nitroglycerin. Follow-up again in 2 months  Update 10/09/2019 Adam [name] is a 59 y.o. male coming in with complaint of right elbow pain. Patient states that he quit using nitro patches due to headaches. Is over all a lot better but still has pain if he "lifts wrong".  Otherwise patient states that he can do everything else relatively well.    Past Medical History:  Diagnosis Date  . Hyperlipidemia    Past Surgical History:  Procedure Laterality Date  . GALLBLADDER SURGERY    . HEMORRHOID SURGERY    . HEMORRHOID SURGERY    . HERNIA REPAIR    . HERNIA REPAIR  2008  . TONSILLECTOMY    . TONSILLECTOMY  1968   Social History   Socioeconomic History  . Marital status: Married    Spouse name: Not on file  . Number of children: 3  . Years of education: 69  . Highest education level: Not on file  Occupational History  . Not on file  Tobacco Use  . Smoking status: Never Smoker  . Smokeless tobacco: Never Used  Substance and Sexual Activity  . Alcohol use: No  . Drug use: No  . Sexual activity: Yes  Other Topics Concern  . Not on file  Social History Narrative  . Not on file   Social Determinants of Health    Financial Resource Strain:   . Difficulty of Paying Living Expenses: Not on file  Food Insecurity:   . Worried About Programme researcher, broadcasting/film/video in the Last Year: Not on file  . Ran Out of Food in the Last Year: Not on file  Transportation Needs:   . Lack of Transportation (Medical): Not on file  . Lack of Transportation (Non-Medical): Not on file  Physical Activity:   . Days of Exercise per Week: Not on file  . Minutes of Exercise per Session: Not on file  Stress:   . Feeling of Stress : Not on file  Social Connections:   . Frequency of Communication with Friends and Family: Not on file  . Frequency of Social Gatherings with Friends and Family: Not on file  . Attends Religious Services: Not on file  . Active Member of Clubs or Organizations: Not on file  . Attends Banker Meetings: Not on file  . Marital Status: Not on file   Allergies  Allergen Reactions  . Ceftin [Cefuroxime Axetil] Rash  . Tetanus Toxoids Swelling    Swelling    Family History  Problem Relation Age of Onset  . Hyperlipidemia Father   . Parkinson's disease Maternal Grandmother   . Cancer Paternal Grandfather     Current Outpatient Medications (Endocrine & Metabolic):  .  clomiPHENE (CLOMID) 50 MG tablet, Take 50 mg by mouth  daily.  Current Outpatient Medications (Cardiovascular):  .  fenofibrate 160 MG tablet, Take 160 mg by mouth every other day.  .  nitroGLYCERIN (NITRODUR - DOSED IN MG/24 HR) 0.2 mg/hr patch, 1/4 patch daily  Current Outpatient Medications (Respiratory):  Marland Kitchen  Albuterol Sulfate (PROAIR HFA IN), Inhale into the lungs as needed. .  mometasone (ASMANEX 14 METERED DOSES) 220 MCG/INH inhaler, Inhale 1 puff into the lungs daily. .  mometasone (NASONEX) 50 MCG/ACT nasal spray, Place 1 spray into the nose daily.    Current Outpatient Medications (Other):  Marland Kitchen  Acetylcysteine (NAC 600) 600 MG CAPS, Take 1 capsule by mouth 2 (two) times daily. .  Cholecalciferol (VITAMIN D-3) 5000  UNITS TABS, Take 1 tablet by mouth daily. Marland Kitchen  DHEA 25 MG CAPS, Take 1 capsule by mouth daily. Marland Kitchen  gabapentin (NEURONTIN) 100 MG capsule, Take 2 capsules (200 mg total) by mouth at bedtime. Boris Lown Oil 300 MG CAPS, Take 1 capsule by mouth 2 (two) times daily. .  Potassium Gluconate 595 MG CAPS, Take 595 mg by mouth daily. .  ranitidine (ZANTAC) 300 MG tablet, Take 300 mg by mouth at bedtime. .  vitamin A 79390 UNIT capsule, Take 25,000 Units by mouth daily.   Reviewed prior external information including notes and imaging from  primary care provider As well as notes that were available from care everywhere and other healthcare systems.  Past medical history, social, surgical and family history all reviewed in electronic medical record.  No pertanent information unless stated regarding to the chief complaint.   Review of Systems:  No headache, visual changes, nausea, vomiting, diarrhea, constipation, dizziness, abdominal pain, skin rash, fevers, chills, night sweats, weight loss, swollen lymph nodes, body aches, joint swelling, chest pain, shortness of breath, mood changes. POSITIVE muscle aches  Objective  Blood pressure 120/88, pulse 86, height 5\' 10"  (1.778 m), weight 217 lb (98.4 kg), SpO2 98 %.   General: No apparent distress alert and oriented x3 mood and affect normal, dressed appropriately.  HEENT: Pupils equal, extraocular movements intact  Respiratory: Patient's speak in full sentences and does not appear short of breath  Cardiovascular: No lower extremity edema, non tender, no erythema  Neuro: Cranial nerves II through XII are intact, neurovascularly intact in all extremities with 2+ DTRs and 2+ pulses.  Gait normal with good balance and coordination.  MSK: Right elbow still mildly tender to palpation in lateral epicondylar region but improvement.  Good strength with resisted extension of the wrist.  Good grip strength overall. Mild right-sided scapular dyskinesis  Limited  musculoskeletal ultrasound was performed and interpreted by  Limited ultrasound of patient's right patient shows the patient still has some increase in Doppler flow in hypoechoic changes.  Mild intrasubstance tearing still noted.  Does show some interval healing noted from previous exam again. Impression: Interval healing    Impression and Recommendations:     The above documentation has been reviewed and is accurate and complete Judi Saa, DO

## 2019-12-09 NOTE — Progress Notes (Deleted)
[name] [address] [place] [address] Phone: [phone] Subjective:    I'm seeing this patient by the request  of:  Nonnie Done., MD  CC:   [id number]   10/09/2019 Patient on ultrasound shows the patient still has some mild hypoechoic changes as well as some increasing Doppler flow.  I would state that patient is approximately 80 to 85%..  Still not complete.  We discussed the different medications including the gabapentin that can help for some of the neck pain as well.  Given exercises to work on the upper back.  Follow-up again in 2 to 3 months.  This is difficult to heal secondary to patient being very very active.  Update 12/09/2019 IORI GIGANTE is a 59 y.o. male coming in with complaint of right elbow pain. Patient states      Past Medical History:  Diagnosis Date  . Hyperlipidemia    Past Surgical History:  Procedure Laterality Date  . GALLBLADDER SURGERY    . HEMORRHOID SURGERY    . HEMORRHOID SURGERY    . HERNIA REPAIR    . HERNIA REPAIR  2008  . TONSILLECTOMY    . TONSILLECTOMY  1968   Social History   Socioeconomic History  . Marital status: Married    Spouse name: Not on file  . Number of children: 3  . Years of education: 90  . Highest education level: Not on file  Occupational History  . Not on file  Tobacco Use  . Smoking status: Never Smoker  . Smokeless tobacco: Never Used  Substance and Sexual Activity  . Alcohol use: No  . Drug use: No  . Sexual activity: Yes  Other Topics Concern  . Not on file  Social History Narrative  . Not on file   Social Determinants of Health   Financial Resource Strain:   . Difficulty of Paying Living Expenses: Not on file  Food Insecurity:   . Worried About Programme researcher, broadcasting/film/video in the Last Year: Not on file  . Ran Out of Food in the Last Year: Not on file  Transportation Needs:   . Lack of Transportation (Medical): Not on file  . Lack of Transportation  (Non-Medical): Not on file  Physical Activity:   . Days of Exercise per Week: Not on file  . Minutes of Exercise per Session: Not on file  Stress:   . Feeling of Stress : Not on file  Social Connections:   . Frequency of Communication with Friends and Family: Not on file  . Frequency of Social Gatherings with Friends and Family: Not on file  . Attends Religious Services: Not on file  . Active Member of Clubs or Organizations: Not on file  . Attends Banker Meetings: Not on file  . Marital Status: Not on file   Allergies  Allergen Reactions  . Ceftin [Cefuroxime Axetil] Rash  . Tetanus Toxoids Swelling    Swelling    Family History  Problem Relation Age of Onset  . Hyperlipidemia Father   . Parkinson's disease Maternal Grandmother   . Cancer Paternal Grandfather     Current Outpatient Medications (Endocrine & Metabolic):  .  clomiPHENE (CLOMID) 50 MG tablet, Take 50 mg by mouth daily.  Current Outpatient Medications (Cardiovascular):  .  fenofibrate 160 MG tablet, Take 160 mg by mouth every other day.  .  nitroGLYCERIN (NITRODUR - DOSED IN MG/24 HR) 0.2 mg/hr patch, 1/4 patch daily  Current Outpatient Medications (Respiratory):  Marland Kitchen  Albuterol Sulfate (PROAIR HFA IN), Inhale into the lungs as needed. .  mometasone (ASMANEX 14 METERED DOSES) 220 MCG/INH inhaler, Inhale 1 puff into the lungs daily. .  mometasone (NASONEX) 50 MCG/ACT nasal spray, Place 1 spray into the nose daily.    Current Outpatient Medications (Other):  Marland Kitchen  Acetylcysteine (NAC 600) 600 MG CAPS, Take 1 capsule by mouth 2 (two) times daily. .  Cholecalciferol (VITAMIN D-3) 5000 UNITS TABS, Take 1 tablet by mouth daily. Marland Kitchen  DHEA 25 MG CAPS, Take 1 capsule by mouth daily. Marland Kitchen  gabapentin (NEURONTIN) 100 MG capsule, Take 2 capsules (200 mg total) by mouth at bedtime. Boris Lown Oil 300 MG CAPS, Take 1 capsule by mouth 2 (two) times daily. .  Potassium Gluconate 595 MG CAPS, Take 595 mg by mouth  daily. .  ranitidine (ZANTAC) 300 MG tablet, Take 300 mg by mouth at bedtime. .  vitamin A 28413 UNIT capsule, Take 25,000 Units by mouth daily.   Reviewed prior external information including notes and imaging from  primary care provider As well as notes that were available from care everywhere and other healthcare systems.  Past medical history, social, surgical and family history all reviewed in electronic medical record.  No pertanent information unless stated regarding to the chief complaint.   Review of Systems:  No headache, visual changes, nausea, vomiting, diarrhea, constipation, dizziness, abdominal pain, skin rash, fevers, chills, night sweats, weight loss, swollen lymph nodes, body aches, joint swelling, chest pain, shortness of breath, mood changes. POSITIVE muscle aches  Objective  There were no vitals taken for this visit.   General: No apparent distress alert and oriented x3 mood and affect normal, dressed appropriately.  HEENT: Pupils equal, extraocular movements intact  Respiratory: Patient's speak in full sentences and does not appear short of breath  Cardiovascular: No lower extremity edema, non tender, no erythema  Neuro: Cranial nerves II through XII are intact, neurovascularly intact in all extremities with 2+ DTRs and 2+ pulses.  Gait normal with good balance and coordination.  MSK:  Non tender with full range of motion and good stability and symmetric strength and tone of shoulders, elbows, wrist, hip, knee and ankles bilaterally.     Impression and Recommendations:     The above documentation has been reviewed and is accurate and complete Wilford Grist

## 2019-12-10 ENCOUNTER — Ambulatory Visit: Payer: Self-pay | Admitting: Family Medicine

## 2022-04-04 DIAGNOSIS — C412 Malignant neoplasm of vertebral column: Secondary | ICD-10-CM

## 2022-04-04 HISTORY — DX: Malignant neoplasm of vertebral column: C41.2

## 2022-04-21 HISTORY — PX: ANTERIOR FUSION CLIVUS-C2 EXTRAORAL W/O ODONTOID EXCISION: SUR619

## 2022-11-03 ENCOUNTER — Other Ambulatory Visit (HOSPITAL_COMMUNITY): Payer: Self-pay | Admitting: Family Medicine

## 2022-11-03 DIAGNOSIS — M79662 Pain in left lower leg: Secondary | ICD-10-CM

## 2023-01-18 ENCOUNTER — Encounter: Payer: Self-pay | Admitting: Gastroenterology

## 2023-03-03 ENCOUNTER — Ambulatory Visit: Payer: BC Managed Care – PPO | Admitting: Gastroenterology

## 2023-03-16 ENCOUNTER — Ambulatory Visit: Payer: BC Managed Care – PPO | Admitting: Gastroenterology

## 2023-03-16 ENCOUNTER — Encounter: Payer: Self-pay | Admitting: Gastroenterology

## 2023-03-16 VITALS — BP 120/60 | Ht 71.0 in | Wt 247.4 lb

## 2023-03-16 DIAGNOSIS — Z7901 Long term (current) use of anticoagulants: Secondary | ICD-10-CM

## 2023-03-16 DIAGNOSIS — Z1211 Encounter for screening for malignant neoplasm of colon: Secondary | ICD-10-CM | POA: Diagnosis not present

## 2023-03-16 MED ORDER — SUFLAVE 178.7 G PO SOLR
1.0000 | Freq: Once | ORAL | 0 refills | Status: DC
Start: 2023-03-16 — End: 2023-05-01

## 2023-03-16 NOTE — Progress Notes (Signed)
 Agree with assessment/plan.  Edman Circle, MD [facility] [phone]

## 2023-03-16 NOTE — Progress Notes (Signed)
 Chief Complaint: discuss colon Primary GI Doctor:Dr. 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  Interval History    Patient presents to discuss colonoscopy, accompanied by wife. Patient denies GERD or dysphagia. Patient denies nausea, vomiting, or weight loss. Patient denies altered bowel habits, abdominal pain, and rectal bleeding. No alcohol use. Nonsmoker. Patient taking Xarelto 20 mg po daily for history of DVT in October prescribed by PCP Dr. Egbert Garibaldi. Patients last colonoscopy/EGD approximately 20 years ago in [place] with Dr.Misenheimer. Per patient normal. No significant family history.  Wt Readings from Last 3 Encounters:  03/16/23 247 lb 6.4 oz (112.2 kg)  10/09/19 217 lb (98.4 kg)  07/04/19 220 lb (99.8 kg)    Past Medical History:  Diagnosis Date   DVT (deep venous thrombosis) (HCC)    Hyperlipidemia    Past Surgical History:  Procedure Laterality Date   GALLBLADDER SURGERY     HEMORRHOID SURGERY     HEMORRHOID SURGERY     HERNIA REPAIR     HERNIA REPAIR  2008   TONSILLECTOMY     TONSILLECTOMY  1968   Current Outpatient Medications  Medication Sig Dispense Refill   Acetylcysteine (NAC 600) 600 MG CAPS Take 1 capsule by mouth 2 (two) times daily.     Albuterol Sulfate (PROAIR HFA IN) Inhale into the lungs as needed.     Cholecalciferol (VITAMIN D-3) 5000 UNITS TABS Take 1 tablet by mouth daily.     DHEA 25 MG CAPS Take 1 capsule by mouth daily.     fenofibrate 160 MG tablet Take 160 mg by mouth every other day.      rivaroxaban (XARELTO) 20 MG TABS tablet Take 20 mg by mouth daily with supper.     No current facility-administered medications for this visit.    Allergies as of 03/16/2023 - Review Complete 03/16/2023  Allergen Reaction Noted   Ceftin [cefuroxime axetil] Rash 11/01/2013   Tetanus toxoids Swelling 11/11/2013     Family History  Problem Relation Age of Onset   Hyperlipidemia Father    Parkinson's disease Maternal Grandmother    Cancer Paternal Grandfather    Review of Systems:    Constitutional: No weight loss, fever, chills, weakness or fatigue HEENT: Eyes: No change in vision               Ears, Nose, Throat:  No change in hearing or congestion Skin: No rash or itching Cardiovascular: No chest pain, chest pressure or palpitations   Respiratory: No SOB or cough Gastrointestinal: See HPI and otherwise negative Genitourinary: No dysuria or change in urinary frequency Neurological: No headache, dizziness or syncope Musculoskeletal: No new muscle or joint pain Hematologic: No bleeding or bruising Psychiatric: No history of depression or anxiety   Physical Exam:  Vital signs: BP 120/60   Ht 5\' 11"  (1.803 m)   Wt 247 lb 6.4 oz (112.2 kg)   BMI 34.51 kg/m   Constitutional:   Pleasant male appears to be in NAD, Well developed, Well nourished, alert and cooperative Throat: Oral cavity and pharynx without inflammation, swelling or lesion.  Respiratory: Respirations even and unlabored. Lungs clear to auscultation bilaterally.   No wheezes, crackles, or rhonchi.  Cardiovascular: Normal S1, S2. Regular rate and rhythm. No peripheral edema, cyanosis or pallor.  Gastrointestinal:  Soft, nondistended, nontender. No rebound or guarding. Normal bowel sounds.  No appreciable masses or hepatomegaly. Rectal:  Not performed.  Msk:  Symmetrical without gross deformities. Without edema, no deformity or joint abnormality.  Neurologic:  Alert and  oriented x4;  grossly normal neurologically.  Skin:   Dry and intact without significant lesions or rashes. Psychiatric: Oriented to person, place and time. Demonstrates good judgement and reason without abnormal affect or behaviors.  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  No new or progressive signal abnormality.  03/13/23 labs show: hgb 17.4, platelets 134, BUN 14, Creat 1.18  Assessment: Encounter Diagnoses  Name Primary?   Special screening for malignant neoplasms, colon Yes   Long term current use of anticoagulant therapy    63 year old male patient who presents to schedule colon screening colonoscopy. Per patient last colonoscopy was 20 years ago. Currently has no GI issues. Patient is on Xarelto prescribed from PCP for history of DVT and stroke, will require clearance to hold for procedures.  Plan: -Schedule a colonoscopy in [facility] with Dr. Chales Abrahams. The risks and benefits of colonoscopy with possible polypectomy / biopsies were discussed and the patient agrees to proceed. Clearance for Xarelto from PCP needed. -Hold Xarelto 2 days before procedure - will instruct when and how to resume after procedure. Risks and benefits of procedure including bleeding, perforation, infection, missed lesions, medication reactions and possible hospitalization or surgery if complications occur explained. Additional rare but real risk of cardiovascular event such as heart attack or ischemia/infarct of other organs off Xarelto explained and need to seek urgent help if this occurs. Will communicate by phone or EMR with patient's prescribing provider that to confirm holding Xarelto is reasonable in this case.   -Request records from previous colonoscopy procedures.   Thank you for the courtesy of this consult. Please call me with any questions or concerns.   Jalene Demo, FNP-C [facility] 03/16/2023, 10:19 AM  Cc: Nonnie Done., MD

## 2023-03-16 NOTE — Patient Instructions (Signed)
 We will contact you about holding your Xarelto for 2 days prior to your procedure.  You have been scheduled for a colonoscopy. Please follow written instructions given to you at your visit today.   If you use inhalers (even only as needed), please bring them with you on the day of your procedure.  DO NOT TAKE 7 DAYS PRIOR TO TEST- Trulicity (dulaglutide) Ozempic, Wegovy (semaglutide) Mounjaro (tirzepatide) Bydureon Bcise (exanatide extended release)  DO NOT TAKE 1 DAY PRIOR TO YOUR TEST Rybelsus (semaglutide) Adlyxin (lixisenatide) Victoza (liraglutide) Byetta (exanatide) ___________________________________________________________________________  Bonita Quin will receive your bowel preparation through [facility], which ensures the lowest copay and home delivery, with outreach via text or call from an [phone] number. Please respond promptly to avoid rescheduling of your procedure. If you are interested in alternative options or have any questions regarding your prep, please contact them at [phone] ____________________________________________________________________________  Your Provider Has Sent Your Bowel Prep Regimen To [facility]   [facility] will contact you to verify your information and collect your copay, if applicable. Enjoy the comfort of your home while your prescription is mailed to you, FREE of any shipping charges.   [facility] accepts all major insurance benefits and applies discounts & coupons.  Have additional questions?   Chat: [email or web address] Call: [phone] Email: [email or web address] NCPDP: [id number]  How will [facility] contact you?  With a Welcome phone call,  a Welcome text and a checkout link in text form.  Texts you receive from [phone] Are NOT Spam.  *To set up delivery, you must complete the checkout process via link or speak to one of the patient care representatives. If [facility] is unable to reach you, your prescription may be  delayed.  To avoid long hold times on the phone, you may also utilize the secure chat feature on the [facility] website to request that they call you back for transaction completion or to expedite your concerns.  Due to recent changes in healthcare laws, you may see the results of your imaging and laboratory studies on MyChart before your provider has had a chance to review them.  We understand that in some cases there may be results that are confusing or concerning to you. Not all laboratory results come back in the same time frame and the provider may be waiting for multiple results in order to interpret others.  Please give [place] 48 hours in order for your provider to thoroughly review all the results before contacting the office for clarification of your results.   _______________________________________________________  If your blood pressure at your visit was 140/90 or greater, please contact your primary care physician to follow up on this.  _______________________________________________________  If you are age 62 or older, your body mass index should be between 23-30. Your Body mass index is 34.51 kg/m. If this is out of the aforementioned range listed, please consider follow up with your Primary Care Provider.  If you are age 83 or younger, your body mass index should be between 19-25. Your Body mass index is 34.51 kg/m. If this is out of the aformentioned range listed, please consider follow up with your Primary Care Provider.   ________________________________________________________  The [facility] providers would like to encourage you to use [facility] to communicate with providers for non-urgent requests or questions.  Due to long hold times on the telephone, sending your provider a message by [facility] may be a faster and more efficient way to get a response.  Please allow 48 business hours for a response.  Please remember that this is for non-urgent requests.   _______________________________________________________  Thank you for trusting me with your gastrointestinal care!   Margarite Gouge May, NP

## 2023-05-01 ENCOUNTER — Other Ambulatory Visit: Payer: Self-pay

## 2023-05-01 ENCOUNTER — Telehealth: Payer: Self-pay | Admitting: Gastroenterology

## 2023-05-01 DIAGNOSIS — Z1211 Encounter for screening for malignant neoplasm of colon: Secondary | ICD-10-CM

## 2023-05-01 MED ORDER — SUFLAVE 178.7 G PO SOLR: 1.0000 | Freq: Once | ORAL | 0 refills | Status: AC

## 2023-05-01 NOTE — Telephone Encounter (Signed)
 Patient wife called and sated that she is needing us  to send over her husband prep medication over to there local pharmacy called [facility] and a good call back number is [phone]. Patient wife stated that they never received her husband prep medication with [facility]. Patient is requesting a call back. Please advise.

## 2023-05-01 NOTE — Telephone Encounter (Signed)
 Pt requested that prep be sent to a local pharmacy as he has not gotten his prep yet.  Prep was sent to local pharmacy as requested. Pt notified that if prep was sent by [facility] just to send it back.  Pt verbalized understanding with all questions answered.

## 2023-05-04 ENCOUNTER — Telehealth: Payer: Self-pay | Admitting: Gastroenterology

## 2023-05-04 NOTE — Telephone Encounter (Signed)
 Patient called and stated that he never received  his prep medication for his procedure through [facility]. Patient stated that he and his wife called to see if it could be sent over to his pharmacy and it was but his insurance will not pay for the prep medication and the pharmacy is charging him over 200 dollars for the prep. Patient is requesting a call back. Please advise.

## 2023-05-05 ENCOUNTER — Other Ambulatory Visit: Payer: Self-pay

## 2023-05-05 DIAGNOSIS — Z1211 Encounter for screening for malignant neoplasm of colon: Secondary | ICD-10-CM

## 2023-05-05 MED ORDER — NA SULFATE-K SULFATE-MG SULF 17.5-3.13-1.6 GM/177ML PO SOLN: 1.0000 | Freq: Once | ORAL | 0 refills | Status: AC

## 2023-05-05 NOTE — Telephone Encounter (Signed)
 Pt stated that the prep that was sent in for him needed a PA and the cash price was going to be over 200 dollars. A different prep was sent to pharmacy. A new prep letter was created. Pt made aware. Pt to pick up prep instructions on Monday on 2nd floor. Pt made aware. Pt verbalized understanding with all questions answered.

## 2023-05-11 ENCOUNTER — Encounter: Payer: Self-pay | Admitting: Gastroenterology

## 2023-05-11 ENCOUNTER — Ambulatory Visit: Admitting: Gastroenterology

## 2023-05-11 VITALS — BP 123/53 | HR 62 | Temp 98.0°F | Resp 16 | Ht 71.0 in | Wt 247.0 lb

## 2023-05-11 DIAGNOSIS — K640 First degree hemorrhoids: Secondary | ICD-10-CM | POA: Diagnosis not present

## 2023-05-11 DIAGNOSIS — Z1211 Encounter for screening for malignant neoplasm of colon: Secondary | ICD-10-CM

## 2023-05-11 DIAGNOSIS — K635 Polyp of colon: Secondary | ICD-10-CM | POA: Diagnosis not present

## 2023-05-11 DIAGNOSIS — K5730 Diverticulosis of large intestine without perforation or abscess without bleeding: Secondary | ICD-10-CM | POA: Diagnosis not present

## 2023-05-11 DIAGNOSIS — D123 Benign neoplasm of transverse colon: Secondary | ICD-10-CM

## 2023-05-11 DIAGNOSIS — K6389 Other specified diseases of intestine: Secondary | ICD-10-CM | POA: Diagnosis not present

## 2023-05-11 MED ORDER — SODIUM CHLORIDE 0.9 % IV SOLN: 500.0000 mL | Freq: Once | INTRAVENOUS | Status: DC

## 2023-05-11 NOTE — Op Note (Signed)
 [facility] Patient Name: Adam [name] Procedure Date: 05/11/2023 10:51 AM MRN: [id number] Endoscopist: Lajuan Pila , MD, [id number] Age: 63 Referring MD:  Date of Birth: 1960-11-18 Gender: Male Account #: [id number] Procedure:                Colonoscopy Indications:              Screening for colorectal malignant neoplasm Medicines:                Monitored Anesthesia Care Procedure:                Pre-Anesthesia Assessment:                           - Prior to the procedure, a History and Physical                            was performed, and patient medications and                            allergies were reviewed. The patient's tolerance of                            previous anesthesia was also reviewed. The risks                            and benefits of the procedure and the sedation                            options and risks were discussed with the patient.                            All questions were answered, and informed consent                            was obtained. Prior Anticoagulants: The patient has                            taken no anticoagulant or antiplatelet agents. ASA                            Grade Assessment: II - A patient with mild systemic                            disease. After reviewing the risks and benefits,                            the patient was deemed in satisfactory condition to                            undergo the procedure.                           After obtaining informed consent, the colonoscope  was passed under direct vision. Throughout the                            procedure, the patient's blood pressure, pulse, and                            oxygen saturations were monitored continuously. The                            CF [id number] #[id number] was introduced through the anus                            and advanced to the the cecum, identified by                            appendiceal orifice  and ileocecal valve. The                            colonoscopy was performed without difficulty. The                            patient tolerated the procedure well. The quality                            of the bowel preparation was good except in the                            right colon where there was adherent stool.                            Nevertheless, aggressive suctioning aspiration was                            performed. The ileocecal valve, appendiceal                            orifice, and rectum were photographed. Scope In: 10:59:34 AM Scope Out: 11:17:17 AM Scope Withdrawal Time: 0 hours 12 minutes 8 seconds  Total Procedure Duration: 0 hours 17 minutes 43 seconds  Findings:                 A 4 mm polyp was found in the proximal transverse                            colon. The polyp was sessile. The polyp was removed                            with a cold snare. Resection and retrieval were                            complete.                           A few rare small-mouthed diverticula were found in  the sigmoid colon.                           Non-bleeding internal hemorrhoids were found during                            retroflexion. The hemorrhoids were small and Grade                            I (internal hemorrhoids that do not prolapse).                            Healed posterior anal fissure was noted.                           The exam was otherwise without abnormality on                            direct and retroflexion views. Complications:            No immediate complications. Estimated Blood Loss:     Estimated blood loss: none. Impression:               - One 4 mm polyp in the proximal transverse colon,                            removed with a cold snare. Resected and retrieved.                           - Minimal sigmoid diverticulosis                           - Non-bleeding internal hemorrhoids.                            - The examination was otherwise normal on direct                            and retroflexion views. Recommendation:           - Patient has a contact number available for                            emergencies. The signs and symptoms of potential                            delayed complications were discussed with the                            patient. Return to normal activities tomorrow.                            Written discharge instructions were provided to the                            patient.                           -  Resume previous diet.                           - Continue present medications.                           - Await pathology results.                           - Repeat colonoscopy for surveillance based on                            pathology results.                           - The findings and recommendations were discussed                            with the patient's family. Lajuan Pila, MD 05/11/2023 11:21:32 AM This report has been signed electronically.

## 2023-05-11 NOTE — Progress Notes (Signed)
 Vss nad trans to pacu

## 2023-05-11 NOTE — Progress Notes (Signed)
 Chief Complaint: discuss colon Primary GI Doctor:Dr. 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   Interval History    Patient presents to discuss colonoscopy, accompanied by wife. Patient denies GERD or dysphagia. Patient denies nausea, vomiting, or weight loss. Patient denies altered bowel habits, abdominal pain, and rectal bleeding. No alcohol use. Nonsmoker. Patient taking Xarelto 20 mg po daily for history of DVT in October prescribed by PCP Dr. Robbin Chill. Patients last colonoscopy/EGD approximately 20 years ago in [place] with Dr.Misenheimer. Per patient normal. No significant family history.      Wt Readings from Last 3 Encounters:  03/16/23 247 lb 6.4 oz (112.2 kg)  10/09/19 217 lb (98.4 kg)  07/04/19 220 lb (99.8 kg)        Past Medical History:  Diagnosis Date   DVT (deep venous thrombosis) (HCC)     Hyperlipidemia               Past Surgical History:  Procedure Laterality Date   GALLBLADDER SURGERY       HEMORRHOID SURGERY       HEMORRHOID SURGERY       HERNIA REPAIR       HERNIA REPAIR   2008   TONSILLECTOMY       TONSILLECTOMY   1968              Current Outpatient Medications  Medication Sig Dispense Refill   Acetylcysteine (NAC 600) 600 MG CAPS Take 1 capsule by mouth 2 (two) times daily.       Albuterol Sulfate (PROAIR HFA IN) Inhale into the lungs as needed.       Cholecalciferol (VITAMIN D-3) 5000 UNITS TABS Take 1 tablet by mouth daily.       DHEA 25 MG CAPS Take 1 capsule by mouth daily.       fenofibrate 160 MG tablet Take 160 mg by mouth every other day.        rivaroxaban (XARELTO) 20 MG TABS tablet Take 20 mg by mouth daily with supper.          No current facility-administered medications for this visit.             Allergies as of 03/16/2023 - Review Complete 03/16/2023  Allergen Reaction Noted    Ceftin [cefuroxime axetil] Rash 11/01/2013   Tetanus toxoids Swelling 11/11/2013           Family History  Problem Relation Age of Onset   Hyperlipidemia Father     Parkinson's disease Maternal Grandmother     Cancer Paternal Grandfather          Review of Systems:    Constitutional: No weight loss, fever, chills, weakness or fatigue HEENT: Eyes: No change in vision               Ears, Nose, Throat:  No change in hearing or congestion Skin: No rash or itching Cardiovascular: No chest pain, chest pressure or palpitations   Respiratory: No SOB or cough Gastrointestinal: See HPI and otherwise negative Genitourinary: No dysuria or change in urinary frequency Neurological: No headache, dizziness or syncope Musculoskeletal: No new muscle or joint pain Hematologic: No bleeding or bruising Psychiatric: No history of depression or anxiety    Physical Exam:  Vital signs: BP 120/60   Ht 5\' 11"  (1.803 m)   Wt  247 lb 6.4 oz (112.2 kg)   BMI 34.51 kg/m    Constitutional:   Pleasant male appears to be in NAD, Well developed, Well nourished, alert and cooperative Throat: Oral cavity and pharynx without inflammation, swelling or lesion.  Respiratory: Respirations even and unlabored. Lungs clear to auscultation bilaterally.   No wheezes, crackles, or rhonchi.  Cardiovascular: Normal S1, S2. Regular rate and rhythm. No peripheral edema, cyanosis or pallor.  Gastrointestinal:  Soft, nondistended, nontender. No rebound or guarding. Normal bowel sounds. No appreciable masses or hepatomegaly. Rectal:  Not performed.  Msk:  Symmetrical without gross deformities. Without edema, no deformity or joint abnormality.  Neurologic:  Alert and  oriented x4;  grossly normal neurologically.  Skin:   Dry and intact without significant lesions or rashes. Psychiatric: Oriented to person, place and time. Demonstrates good judgement and reason without abnormal affect or behaviors.   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  No new or progressive signal abnormality.  03/13/23 labs show: hgb 17.4, platelets 134, BUN 14, Creat 1.18   Assessment:     Encounter Diagnoses  Name Primary?   Special screening for malignant neoplasms, colon Yes   Long term current use of anticoagulant therapy     63 year old male patient who presents to schedule colon screening colonoscopy. Per patient last colonoscopy was 20 years ago. Currently has no GI issues. Patient is on Xarelto prescribed from PCP for history of DVT and stroke, will require clearance to hold for procedures.   Plan: -Schedule a colonoscopy in [facility] with Dr. Venice Gillis. The risks and benefits of colonoscopy with possible polypectomy / biopsies were discussed and the patient agrees to proceed. Clearance for Xarelto from PCP needed. -Hold Xarelto 2 days before procedure - will instruct when and how to resume after procedure. Risks and benefits of procedure including bleeding, perforation, infection, missed lesions, medication reactions and possible hospitalization or surgery if complications occur explained. Additional rare but real risk of cardiovascular event such as heart attack or ischemia/infarct of other organs off Xarelto explained and need to seek urgent help if this occurs. Will communicate by phone or EMR with patient's prescribing provider that to confirm holding Xarelto is reasonable in this case.   -Request records from previous colonoscopy procedures.   Thank you for the courtesy of this consult. Please call me with any questions or concerns.    Deanna May, FNP-C    Attending physician's note   I have taken history, reviewed the chart and examined the patient. I performed a substantive portion of this encounter, including complete performance of at least one of the key components, in conjunction with the APP. I  agree with the Advanced Practitioner's note, impression and recommendations.   Colon today off Xeralto   Raj Sheryle Vice, MD [facility] [phone]

## 2023-05-11 NOTE — Progress Notes (Signed)
 Called to room to assist during endoscopic procedure.  Patient ID and intended procedure confirmed with present staff. Received instructions for my participation in the procedure from the performing physician.

## 2023-05-11 NOTE — Progress Notes (Signed)
 Pt's states no medical or surgical changes since previsit or office visit.

## 2023-05-11 NOTE — Patient Instructions (Signed)
-  Handout on polyps, diverticulosis and hemorrhoids provided -await pathology results -repeat colonoscopy for surveillance recommended. Date to be determined when pathology result become available   -Continue present medications   YOU HAD AN ENDOSCOPIC PROCEDURE TODAY AT THE [facility]:   Refer to the procedure report that was given to you for any specific questions about what was found during the examination.  If the procedure report does not answer your questions, please call your gastroenterologist to clarify.  If you requested that your care partner not be given the details of your procedure findings, then the procedure report has been included in a sealed envelope for you to review at your convenience later.  YOU SHOULD EXPECT: Some feelings of bloating in the abdomen. Passage of more gas than usual.  Walking can help get rid of the air that was put into your GI tract during the procedure and reduce the bloating. If you had a lower endoscopy (such as a colonoscopy or flexible sigmoidoscopy) you may notice spotting of blood in your stool or on the toilet paper. If you underwent a bowel prep for your procedure, you may not have a normal bowel movement for a few days.  Please Note:  You might notice some irritation and congestion in your nose or some drainage.  This is from the oxygen used during your procedure.  There is no need for concern and it should clear up in a day or so.  SYMPTOMS TO REPORT IMMEDIATELY:  Following lower endoscopy (colonoscopy or flexible sigmoidoscopy):  Excessive amounts of blood in the stool  Significant tenderness or worsening of abdominal pains  Swelling of the abdomen that is new, acute  Fever of 100F or higher   For urgent or emergent issues, a gastroenterologist can be reached at any hour by calling [phone]. Do not use MyChart messaging for urgent concerns.    DIET:  We do recommend a small meal at first, but then you may proceed to your  regular diet.  Drink plenty of fluids but you should avoid alcoholic beverages for 24 hours.  ACTIVITY:  You should plan to take it easy for the rest of today and you should NOT DRIVE or use heavy machinery until tomorrow (because of the sedation medicines used during the test).    FOLLOW UP: Our staff will call the number listed on your records the next business day following your procedure.  We will call around 7:15- 8:00 am to check on you and address any questions or concerns that you may have regarding the information given to you following your procedure. If we do not reach you, we will leave a message.     If any biopsies were taken you will be contacted by phone or by letter within the next 1-3 weeks.  Please call us at [phone] if you have not heard about the biopsies in 3 weeks.    SIGNATURES/CONFIDENTIALITY: You and/or your care partner have signed paperwork which will be entered into your electronic medical record.  These signatures attest to the fact that that the information above on your After Visit Summary has been reviewed and is understood.  Full responsibility of the confidentiality of this discharge information lies with you and/or your care-partner.

## 2023-05-12 ENCOUNTER — Telehealth: Payer: Self-pay

## 2023-05-12 NOTE — Telephone Encounter (Signed)
  Follow up Call-     05/11/2023    9:22 AM  Call back number  Post procedure Call Back phone  # [phone]  Permission to leave phone message Yes     Patient questions:  Do you have a fever, pain , or abdominal swelling? No. Pain Score  0 *  Have you tolerated food without any problems? Yes.    Have you been able to return to your normal activities? Yes.    Do you have any questions about your discharge instructions: Diet   No. Medications  No. Follow up visit  No.  Do you have questions or concerns about your Care? No.  Actions: * If pain score is 4 or above: No action needed, pain <4.

## 2023-05-15 LAB — SURGICAL PATHOLOGY

## 2023-05-22 ENCOUNTER — Ambulatory Visit: Payer: Self-pay | Admitting: Gastroenterology
